# Patient Record
Sex: Male | Born: 1960 | Race: White | Hispanic: No | Marital: Married | Smoking: Never smoker
Health system: Southern US, Community
[De-identification: ages and names within clinical notes are randomized; demographics above are authoritative.]

## PROBLEM LIST (undated history)

## (undated) DIAGNOSIS — E7800 Pure hypercholesterolemia, unspecified: Secondary | ICD-10-CM

## (undated) HISTORY — PX: VASECTOMY: SHX75

## (undated) HISTORY — PX: HERNIA REPAIR: SHX51

---

## 2002-01-04 ENCOUNTER — Encounter: Payer: Self-pay | Admitting: Family Medicine

## 2002-01-04 ENCOUNTER — Encounter: Admission: RE | Admit: 2002-01-04 | Discharge: 2002-01-04 | Payer: Self-pay | Admitting: Family Medicine

## 2004-10-28 ENCOUNTER — Encounter: Admission: RE | Admit: 2004-10-28 | Discharge: 2004-10-28 | Payer: Self-pay | Admitting: Family Medicine

## 2005-10-24 IMAGING — CR DG SHOULDER 2+V*L*
3 series · 3 of 3 positions shown · non-contrast
Comparison: none

CLINICAL DATA: Left shoulder pain.  No trauma. 
 LEFT SHOULDER COMPLETE: 
 Three views of the left shoulder including AP, internally and externally rotated views, and a tangential view show no evidence of fracture, dislocation, or foreign body.

[view not recorded (1 of 3)]
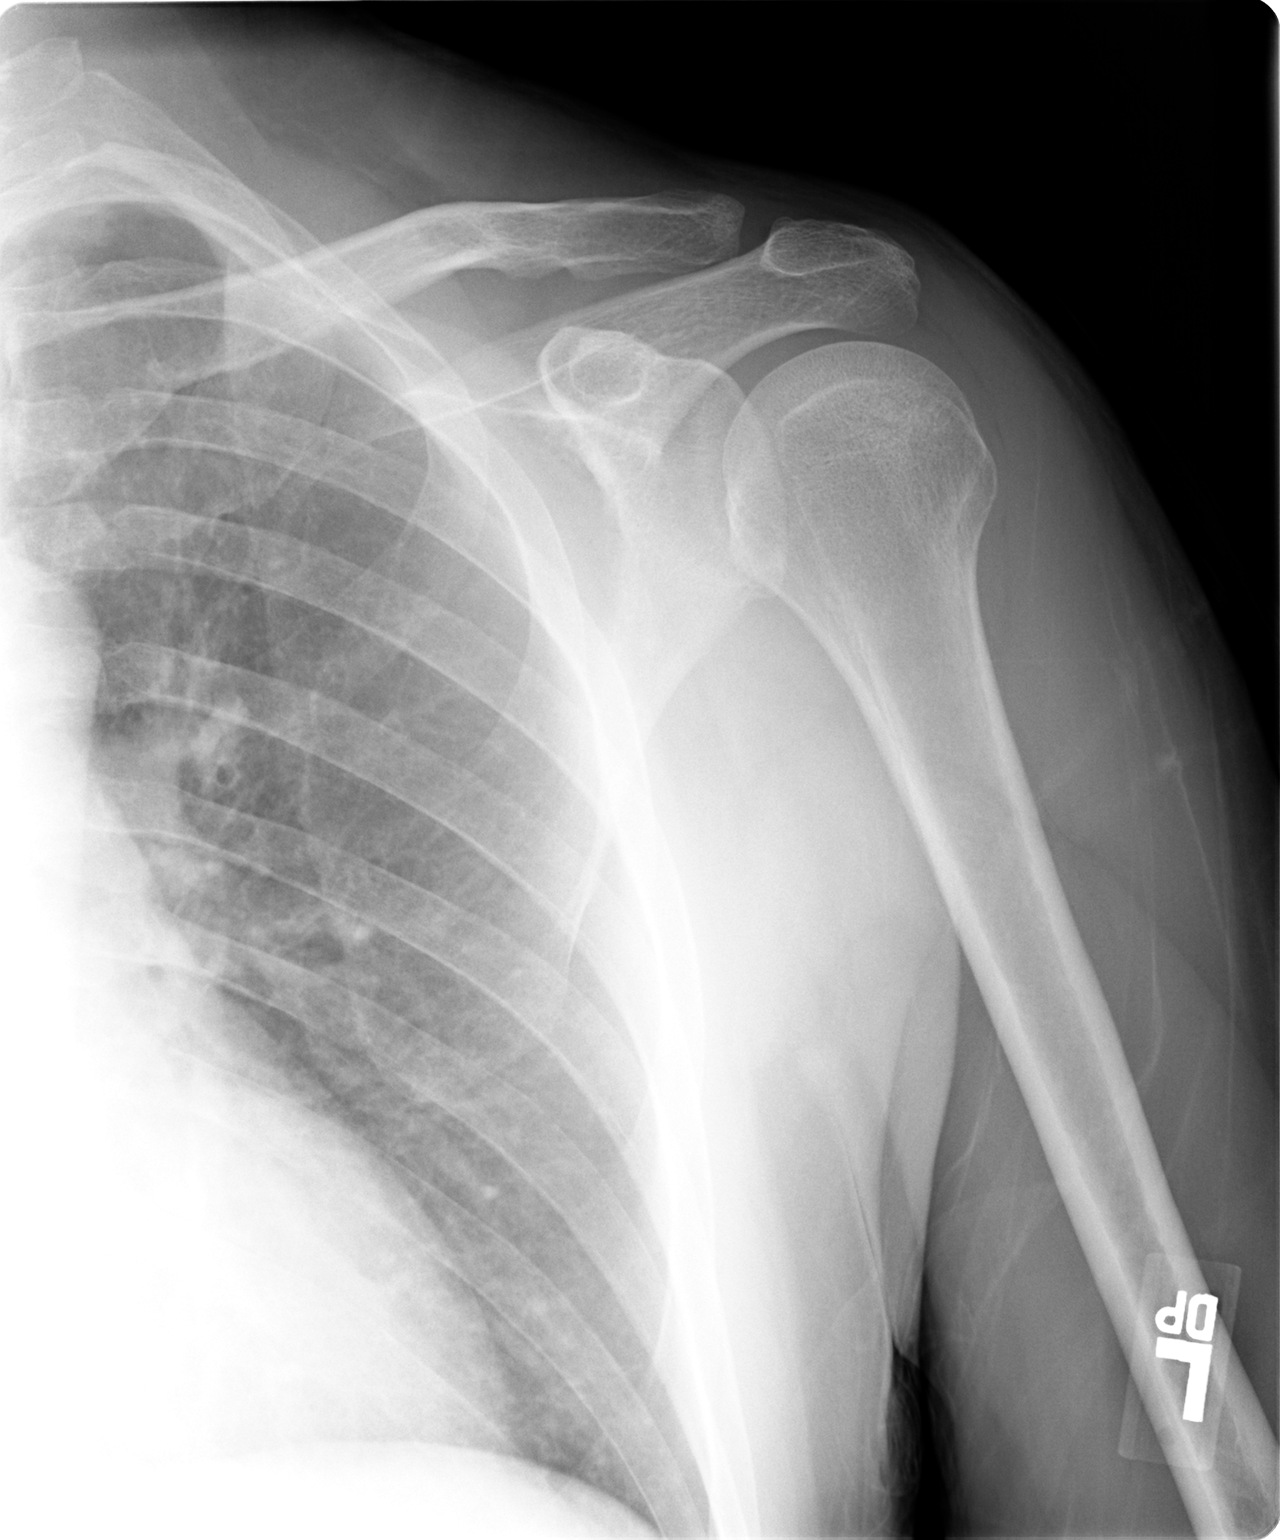

[view not recorded (2 of 3)]
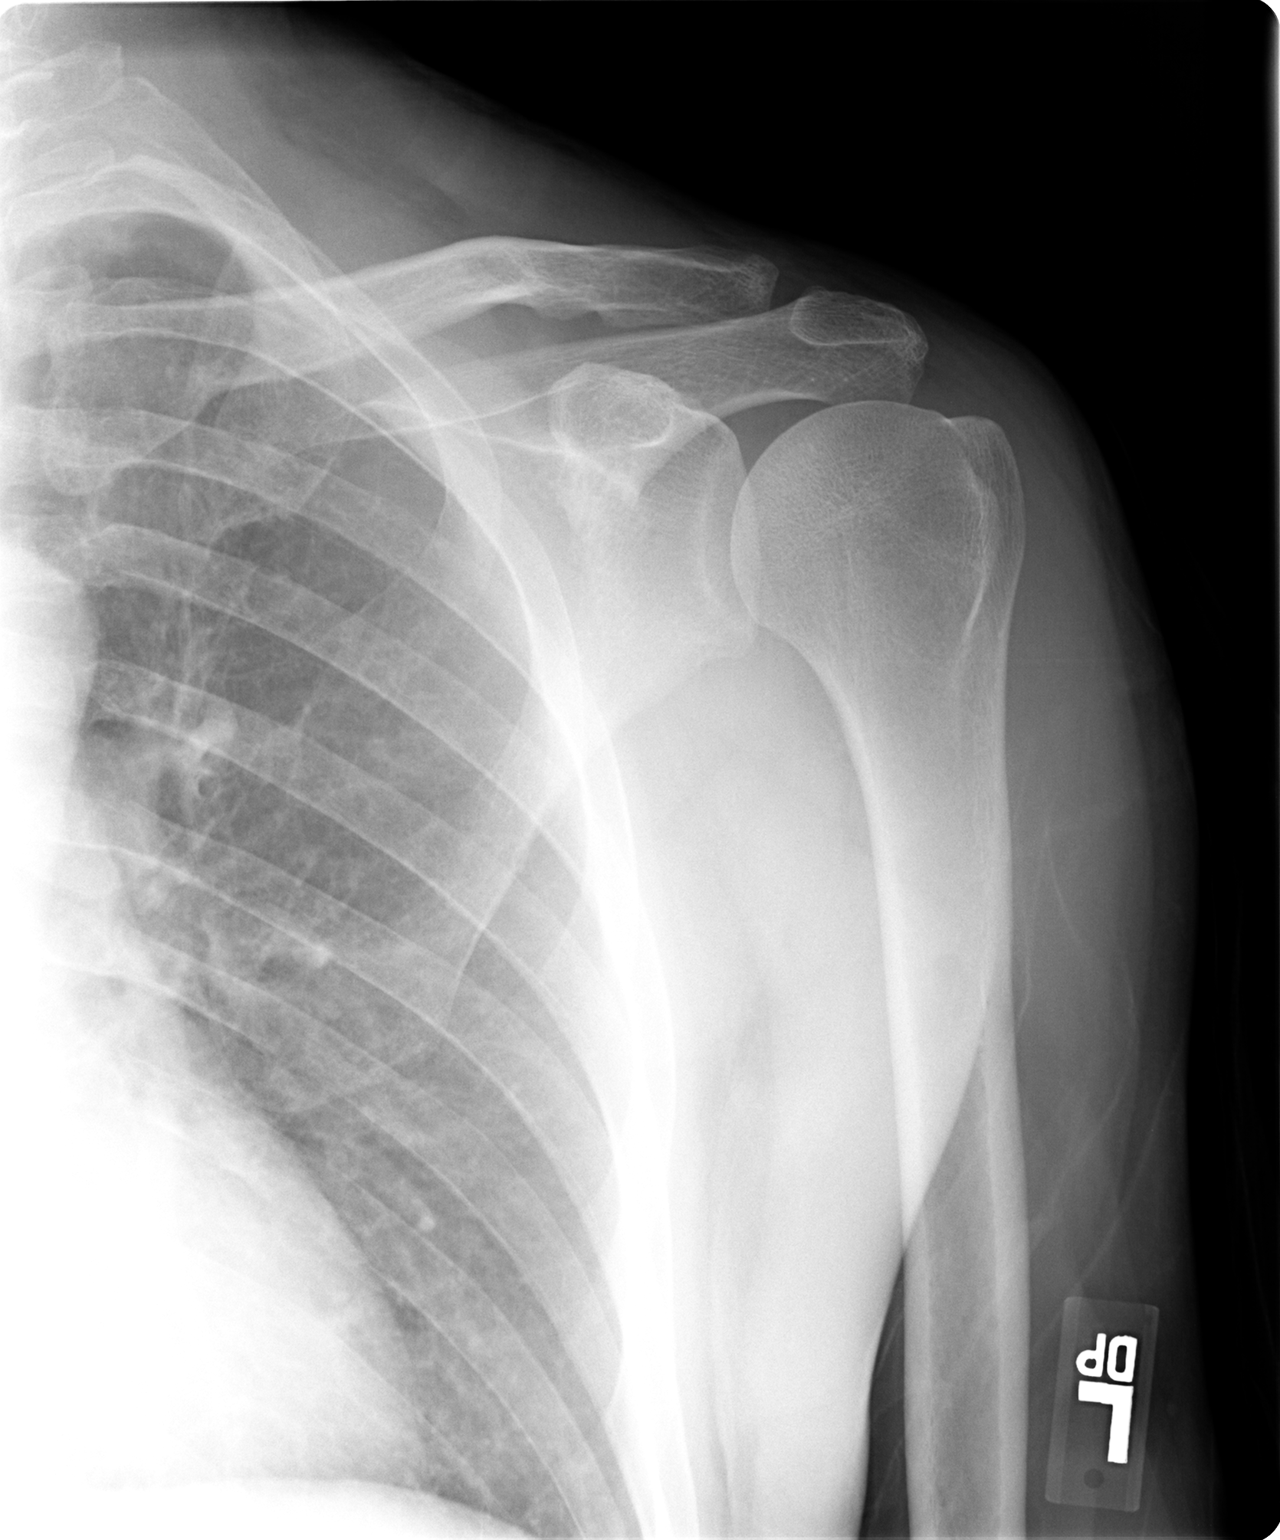

[view not recorded (3 of 3)]
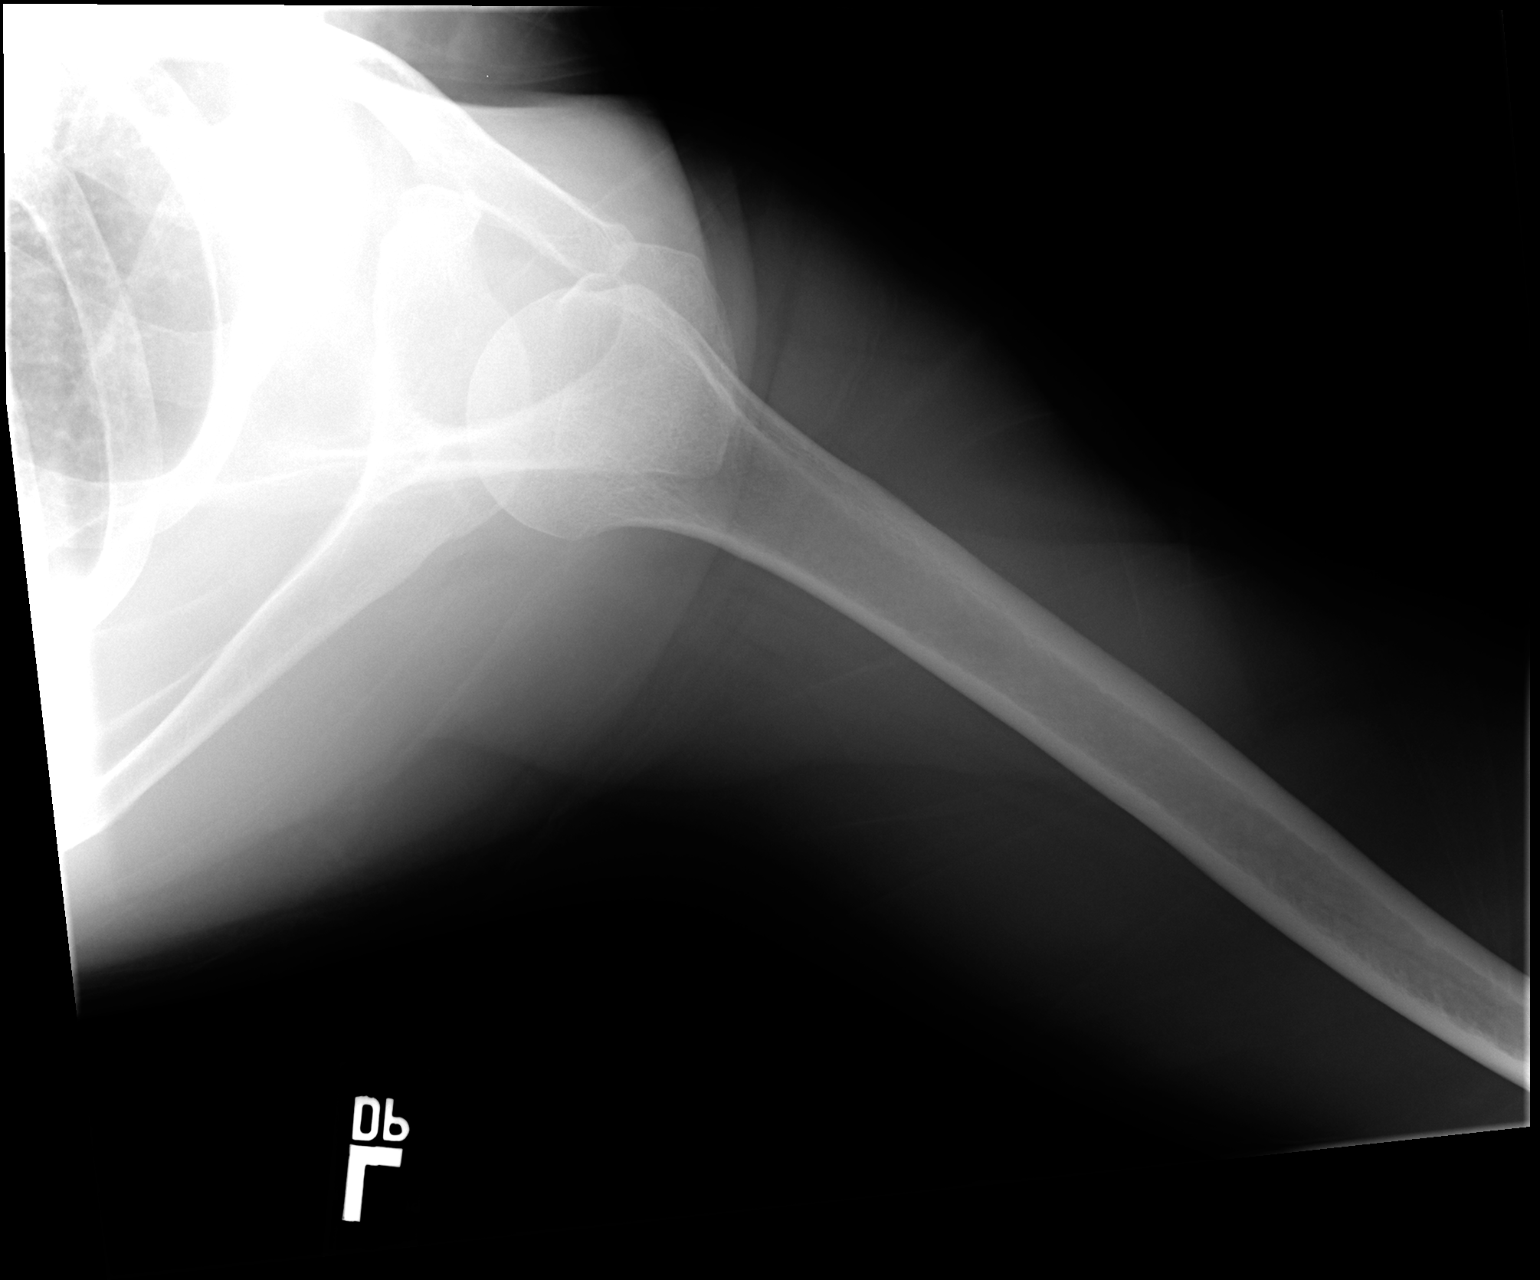

[3 of 3 positions shown; findings below may reference images not displayed]

IMPRESSION: Normal left shoulder.

## 2017-07-19 ENCOUNTER — Other Ambulatory Visit: Payer: Self-pay

## 2017-07-19 ENCOUNTER — Encounter (HOSPITAL_COMMUNITY): Payer: Self-pay | Admitting: Emergency Medicine

## 2017-07-19 ENCOUNTER — Emergency Department (HOSPITAL_COMMUNITY): Payer: Managed Care, Other (non HMO)

## 2017-07-19 ENCOUNTER — Emergency Department (HOSPITAL_COMMUNITY)
Admission: EM | Admit: 2017-07-19 | Discharge: 2017-07-19 | Disposition: A | Discharge status: Home / Self Care | Payer: Managed Care, Other (non HMO) | Attending: Emergency Medicine | Admitting: Emergency Medicine

## 2017-07-19 DIAGNOSIS — R197 Diarrhea, unspecified: Secondary | ICD-10-CM | POA: Diagnosis not present

## 2017-07-19 DIAGNOSIS — Z79899 Other long term (current) drug therapy: Secondary | ICD-10-CM | POA: Insufficient documentation

## 2017-07-19 DIAGNOSIS — R569 Unspecified convulsions: Secondary | ICD-10-CM | POA: Insufficient documentation

## 2017-07-19 HISTORY — DX: Pure hypercholesterolemia, unspecified: E78.00

## 2017-07-19 LAB — CBC WITH DIFFERENTIAL/PLATELET
Basophils Absolute: 0 10*3/uL (ref 0.0–0.1)
Basophils Relative: 0 %
Eosinophils Absolute: 0.1 10*3/uL (ref 0.0–0.7)
Eosinophils Relative: 0 %
HCT: 44.3 % (ref 39.0–52.0)
Hemoglobin: 15 g/dL (ref 13.0–17.0)
Lymphocytes Relative: 12 %
Lymphs Abs: 1.8 10*3/uL (ref 0.7–4.0)
MCH: 29.5 pg (ref 26.0–34.0)
MCHC: 33.9 g/dL (ref 30.0–36.0)
MCV: 87 fL (ref 78.0–100.0)
Monocytes Absolute: 0.7 10*3/uL (ref 0.1–1.0)
Monocytes Relative: 5 %
Neutro Abs: 12.7 10*3/uL — ABNORMAL HIGH (ref 1.7–7.7)
Neutrophils Relative %: 83 %
Platelets: 179 10*3/uL (ref 150–400)
RBC: 5.09 MIL/uL (ref 4.22–5.81)
RDW: 13 % (ref 11.5–15.5)
WBC: 15.3 10*3/uL — ABNORMAL HIGH (ref 4.0–10.5)

## 2017-07-19 LAB — PHOSPHORUS: PHOSPHORUS: 4.1 mg/dL (ref 2.5–4.6)

## 2017-07-19 LAB — COMPREHENSIVE METABOLIC PANEL
ALT: 28 U/L (ref 17–63)
AST: 54 U/L — ABNORMAL HIGH (ref 15–41)
Albumin: 3.7 g/dL (ref 3.5–5.0)
Alkaline Phosphatase: 64 U/L (ref 38–126)
Anion gap: 10 (ref 5–15)
BUN: 24 mg/dL — ABNORMAL HIGH (ref 6–20)
CO2: 26 mmol/L (ref 22–32)
Calcium: 8.4 mg/dL — ABNORMAL LOW (ref 8.9–10.3)
Chloride: 105 mmol/L (ref 101–111)
Creatinine, Ser: 1.29 mg/dL — ABNORMAL HIGH (ref 0.61–1.24)
GFR calc Af Amer: >60 mL/min (ref >60)
GFR calc non Af Amer: 60 mL/min — ABNORMAL LOW (ref >60)
Glucose, Bld: 179 mg/dL — ABNORMAL HIGH (ref 65–99)
Potassium: 4.2 mmol/L (ref 3.5–5.1)
Sodium: 141 mmol/L (ref 135–145)
Total Bilirubin: 0.8 mg/dL (ref 0.3–1.2)
Total Protein: 5.8 g/dL — ABNORMAL LOW (ref 6.5–8.1)

## 2017-07-19 LAB — RAPID URINE DRUG SCREEN, HOSP PERFORMED
Amphetamines: NOT DETECTED
BARBITURATES: NOT DETECTED
Benzodiazepines: NOT DETECTED
Cocaine: NOT DETECTED
Opiates: NOT DETECTED
Tetrahydrocannabinol: NOT DETECTED

## 2017-07-19 LAB — MAGNESIUM: MAGNESIUM: 1.9 mg/dL (ref 1.7–2.4)

## 2017-07-19 LAB — POC OCCULT BLOOD, ED: Fecal Occult Bld: POSITIVE — AB

## 2017-07-19 MED ORDER — LOPERAMIDE HCL 2 MG PO CAPS
4.0000 mg | ORAL_CAPSULE | Freq: Once | ORAL | Status: AC
  Administered 2017-07-19: 4 mg via ORAL
  Filled 2017-07-19: qty 2

## 2017-07-19 MED ORDER — LOPERAMIDE HCL 2 MG PO CAPS: 4.0000 mg | ORAL_CAPSULE | ORAL | 0 refills | Status: AC | PRN

## 2017-07-19 MED ORDER — MAGNESIUM SULFATE 2 GM/50ML IV SOLN
2.0000 g | Freq: Once | INTRAVENOUS | Status: AC
  Administered 2017-07-19: 2 g via INTRAVENOUS
  Filled 2017-07-19: qty 50

## 2017-07-19 MED ORDER — LEVETIRACETAM 500 MG PO TABS: 500.0000 mg | ORAL_TABLET | Freq: Two times a day (BID) | ORAL | 0 refills | Status: AC

## 2017-07-19 MED ORDER — LACTATED RINGERS IV BOLUS (SEPSIS)
1000.0000 mL | Freq: Once | INTRAVENOUS | Status: AC
  Administered 2017-07-19: 1000 mL via INTRAVENOUS

## 2017-07-19 NOTE — ED Notes (Signed)
Dr. Rhunette CroftNanavati ( EDP) explained tests results and plan of care to pt. and spouse.

## 2017-07-19 NOTE — ED Provider Notes (Signed)
[facility] EMERGENCY [facility] Provider Note   CSN: [id number] Arrival date & time: 07/19/17  0050     History   Chief Complaint Chief Complaint  Patient presents with  . Seizures  . Abdominal Cramping  . Urticaria    HPI Edwin Davis is a 56 y.o. male.  HPI  Patient comes in with chief complaint of seizure-like episode. Patient has history of high cholesterol.  He reports that prior to emergency room arrival he woke up feeling uneasy, started having some itching and abdominal cramping followed by seizure-like episode.  The episode was witnessed by patient's wife.  Patient's wife reports that she had turned away from the patient momentarily, and when she refocused on him patient was having generalized tonic-clonic shaking of the upper extremity.  Patient's episode lasted about 3-5 minutes.  There was no incontinence, however patient was slightly confused after the episode.  After EMS arrived patient had another brief episode with similar symptoms.  Patient reports that he had clammy skin prior to the seizure.  Patient denies any headaches, recent trauma.  Patient also denies any substance abuse, heavy alcohol use or smoking.  About a year ago patient had a seizure-like episode.  That episode occurred while patient was watching football game in 90 degree heat and drinking alcohol.  Additionally wife reports that at that time patient lost consciousness first and then had shaking.  She thinks that the second episode did reveal loss of consciousness followed by shaking.   Patient denies any family history of seizures or cardiac pathology.  Patient is having some abdominal discomfort with loose bowel movements.  Nurse reported that patient's diarrhea-like bowel movement had blood streak in it.  Patient has no history of GI bleed.  Patient has had 3 episodes in the ER of loose watery stools.  Past Medical History:  Diagnosis Date  . Hypercholesteremia     There  are no active problems to display for this patient.   Past Surgical History:  Procedure Laterality Date  . HERNIA REPAIR    . VASECTOMY         Home Medications    Prior to Admission medications   Medication Sig Start Date End Date Taking? Authorizing Provider  calcium carbonate (TUMS EX) 750 MG chewable tablet Chew 2 tablets by mouth once.   Yes [provider]  Lutein 10 MG TABS Take 10 mg by mouth daily.   Yes [provider]  simvastatin (ZOCOR) 20 MG tablet Take 20 mg by mouth daily.   Yes [provider]  levETIRAcetam (KEPPRA) 500 MG tablet Take 1 tablet (500 mg total) 2 (two) times daily by mouth. 07/19/17   Derwood KaplanNanavati, Kaiser Belluomini, MD  loperamide (IMODIUM) 2 MG capsule Take 2 capsules (4 mg total) as needed by mouth for diarrhea or loose stools (for night time symptoms only). 07/19/17   Derwood KaplanNanavati, Kynnedi Zweig, MD    Family History No family history on file.  Social History Social History   Tobacco Use  . Smoking status: Never Smoker  . Smokeless tobacco: Never Used  Substance Use Topics  . Alcohol use: Yes  . Drug use: No     Allergies   Hydrocodone   Review of Systems Review of Systems  All other systems reviewed and are negative.    Physical Exam Updated Vital Signs BP 138/86   Pulse 80   Resp 16   Ht 5\' 8"  (1.727 m)   Wt 77.1 kg (170 lb)  SpO2 94%   BMI 25.85 kg/m   Physical Exam  Constitutional: He is oriented to person, place, and time. He appears well-developed.  HENT:  Head: Normocephalic and atraumatic.  Eyes: Conjunctivae and EOM are normal. Pupils are equal, round, and reactive to light.  Neck: Normal range of motion. Neck supple.  Cardiovascular: Normal rate, regular rhythm and normal heart sounds.  Pulmonary/Chest: Effort normal and breath sounds normal. No respiratory distress. He has no wheezes.  Abdominal: Soft. Bowel sounds are normal. He exhibits no distension. There is tenderness. There is no rebound and no  guarding.  Neurological: He is alert and oriented to person, place, and time. No cranial nerve deficit. Coordination normal.  Skin: Skin is warm.  Nursing note and vitals reviewed.    ED Treatments / Results  Labs (all labs ordered are listed, but only abnormal results are displayed) Labs Reviewed  CBC WITH DIFFERENTIAL/PLATELET - Abnormal; Notable for the following components:      Result Value   WBC 15.3 (*)    Neutro Abs 12.7 (*)    All other components within normal limits  COMPREHENSIVE METABOLIC PANEL - Abnormal; Notable for the following components:   Glucose, Bld 179 (*)    BUN 24 (*)    Creatinine, Ser 1.29 (*)    Calcium 8.4 (*)    Total Protein 5.8 (*)    AST 54 (*)    GFR calc non Af Amer 60 (*)    All other components within normal limits  POC OCCULT BLOOD, ED - Abnormal; Notable for the following components:   Fecal Occult Bld POSITIVE (*)    All other components within normal limits  RAPID URINE DRUG SCREEN, HOSP PERFORMED  MAGNESIUM  PHOSPHORUS    EKG  EKG Interpretation None       Radiology Ct Head Wo Contrast  Result Date: 07/19/2017 CLINICAL DATA:  New onset seizure.  Assess TIA. EXAM: CT HEAD WITHOUT CONTRAST TECHNIQUE: Contiguous axial images were obtained from the base of the skull through the vertex without intravenous contrast. COMPARISON:  None. FINDINGS: BRAIN: No intraparenchymal hemorrhage, mass effect nor midline shift. The ventricles and sulci are normal. No acute large vascular territory infarcts. Focal hypodensity bilateral occipital lobes (axial 14/34) associated with calvarial scalloping. No abnormal extra-axial fluid collections. Basal cisterns are patent. VASCULAR: Trace calcific atherosclerosis carotid siphons. SKULL/SOFT TISSUES: No skull fracture. No significant soft tissue swelling. ORBITS/SINUSES: The included ocular globes and orbital contents are normal.RIGHT maxillary mucosal retention cysts and ethmoid sinus mucosal thickening  without air-fluid levels. Mastoid air cells are well aerated. OTHER: None. IMPRESSION: 1. Bioccipital skull beam hardening artifact, less likely small old infarcts. 2. Otherwise negative noncontrast CT HEAD. Electronically Signed   By: Awilda Metro M.D.   On: 07/19/2017 02:07    Procedures Procedures (including critical care time)  Medications Ordered in ED Medications  magnesium sulfate IVPB 2 g 50 mL (0 g Intravenous Stopped 07/19/17 0426)  lactated ringers bolus 1,000 mL (0 mLs Intravenous Stopped 07/19/17 0437)  loperamide (IMODIUM) capsule 4 mg (4 mg Oral Given 07/19/17 0426)     Initial Impression / Assessment and Plan / ED Course  I have reviewed the triage vital signs and the nursing notes.  Pertinent labs & imaging results that were available during my care of the patient were reviewed by me and considered in my medical decision making (see chart for details).  Clinical Course as of Jul 20 751  Wynelle Link Jul 19, 2017  1610  Results from the ER workup discussed with the patient face to face and all questions answered to the best of my ability. Strict ER return precautions have been discussed, and patient is agreeing with the plan and is comfortable with the workup done and the recommendations from the ER.   [AN]    Clinical Course User Index [AN] Derwood Kaplan, MD    Patient comes in with chief complaint of seizure-like episode DDx: -Seizure disorder -Meningitis -Trauma -ICH -Electrolyte abnormality -Metabolic derangement -Stroke -Toxin induced seizures -Medication side effects  Neurologic exam is nonfocal and patient is AAO x3 during my exam. Patient had 2 episodes of seizure-like activity with tonic-clonic shaking.  There was a very small postictal phase based on history.  Also there was no incontinence or tongue biting.  Patient had similar episode one year ago, at which time patient had a syncope seizure-like episode.  According to wife prior to the second episode  of seizure-like activity patient did lose consciousness for a brief second before he started shaking.  Medical history and social history is unremarkable.  CT scan of the head ordered given patient's age over 34, and it is normal.  No recent infection, no new medications, no family history of cardiovascular, neurologic disorders.  We will start patient on Keppra.  Case discussed with Dr. Onalee Hua, neurologist.  He Dr. Amada Jupiter thinks that since the patient episode lasted 3-5 minutes it is unlikely to be syncope seizure.  Patient will be asked to follow-up with neurologist as an outpatient.  Strict return precautions will be discussed.  As far as the GI symptoms are concerned, patient has had 3 loose bowel movements in the emergency room.  There has been streaks of blood in at least 2 of those episodes.  Hemoglobin is normal.  Patient is not on any blood thinners or antiplatelet agents.  It is possible that he is having colitis.  Although patient is having some cramping abdominal pain, his abdominal exam is nonsurgical.  For now we will treat the GI symptoms symptomatically only.  PCP follow-up advised if the symptoms persist.      Final Clinical Impressions(s) / ED Diagnoses   Final diagnoses:  Seizure-like activity (HCC)  Diarrhea, unspecified type    New Prescriptions This SmartLink is deprecated. Use AVSMEDLIST instead to display the medication list for a patient.   Derwood Kaplan, MD 07/19/17 [phone]

## 2017-07-19 NOTE — Discharge Instructions (Signed)
We are not sure what is causing your symptoms of abdominal cramping and diarrhea.  We suspect this could be a mild infection or food poisoning related issue.  Please hydrate well and take the diarrhea medicine for nighttime symptoms only. Symptoms get worse, you have worsening bloody stools, high fevers  The neurologist and I discussed the case about your seizure-like episode.  You could have primary seizures or a syncope seizure.  Because 1 of her episode lasted 3-5 minutes the neurologist recommends starting you on Keppra and having you see outpatient neurologist.  Return to the ER if you have a repeat seizure.  [place] law prevents people with seizures or fainting from driving or operating dangerous machinery until they are free of seizures or fainting for 6 months.

## 2017-07-19 NOTE — ED Triage Notes (Addendum)
Patient arrived with EMS from home reports 2 episodes of grand mal seizures this evening with chills , skin redness/skin itching and low abdominal cramping after eating grilled chicken , hamburger , peanuts and soda . Alert and oriented at arrival , he had a large amount of soft stool (mild pink in color) at arrival positive hemoccult test .

## 2017-07-20 ENCOUNTER — Encounter: Payer: Self-pay | Admitting: Neurology

## 2017-07-22 NOTE — ED Notes (Signed)
Pt. Called this Rn and requested a [facility].  Explained to pt. That our doctors do not have a form for a referral.  I explained to pt. That on his discharge papers that it is recommended for him to call a certain neurologist.  Pt. Reports that he did call the recommended Neurologist , but they are not able to give him a appointment for 2 months.  I recommended to him to call his PCP to possibly get a referral for [facility].  Pt. Was upset that we were not able to give him a referral to [facility].  I apologize to pt. 07/22/2017, 11:33

## 2017-10-01 ENCOUNTER — Ambulatory Visit: Payer: Managed Care, Other (non HMO) | Admitting: Neurology

## 2017-10-05 ENCOUNTER — Ambulatory Visit (INDEPENDENT_AMBULATORY_CARE_PROVIDER_SITE_OTHER): Payer: Managed Care, Other (non HMO) | Admitting: Neurology

## 2017-10-05 ENCOUNTER — Encounter: Payer: Self-pay | Admitting: Neurology

## 2017-10-05 VITALS — BP 146/80 | HR 79 | Ht 68.0 in | Wt 173.0 lb

## 2017-10-05 DIAGNOSIS — R55 Syncope and collapse: Secondary | ICD-10-CM

## 2017-10-05 NOTE — Progress Notes (Signed)
NEUROLOGY CONSULTATION NOTE  Edwin Davis [place] MRN: [id number] DOB: 06-18-1961  Referring provider: Dr. Derwood KaplanAnkit Nanavati (ER) Primary care provider: Dr. Blair Heysobert Ehinger  Reason for consult:  Seizure-like activity  Dear Dr Rhunette CroftNanavati:  Thank you for your kind referral of Edwin Davis [place] for consultation of the above symptoms. Although his history is well known to you, please allow me to reiterate it for the purpose of our medical record. The patient was accompanied to the clinic by his wife who also provides collateral information. Records and images were personally reviewed where available.  HISTORY OF PRESENT ILLNESS: This is a pleasant 57 year old right-handed man with a history of hyperlipidemia, presenting for evaluation of seizure-like activity that occurred last 07/19/2017. He was in his usual state of health then later in the afternoon he started feeling lightheaded, flushed, and sweaty with stomach cramping. He got off the commode and asked his wife for antacid and lay on the bathroom floor. When she came back, she found him unconscious with shivering/slight jerking of the upper extremities. She called EMS who gave her instructions, and when she came back to the bathroom, found him sitting on the floor, no confusion noted. He was asking who she was talking to on the phone. He wanted to sit on the chair and was still flushed, sweaty, lightheaded with abdominal cramping. He was sitting when EMS arrived and passed out again, no shaking. They were told his BP was so low. [facility] signs were normal. He started having diarrhea with blood-streaked stools in the ER. The diarrhea and cramping lasted for 6 hours, then self-resolved. He had a head CT which I personally reviewed, no acute changes seen. Due to concern for seizures, he was discharged home on Keppra, which he only took for a day after reading about potential side effects. They report another episode of loss of  consciousness in September 2017, they were at a football game in hot weather and he reached down for water, then he had significant back spasms and had a weird feeling, then passed out/slumped down for a couple of minutes. There was also slight shaking/upper extremity jerking as they tried to lay him down. He was also told his BP was low at that time.  He and his wife deny any staring/unresponsive episodes, gaps in time, olfactory/gustatory hallucinations, deja vu, rising epigastric sensation, focal numbness/tingling/weakness, myoclonic jerks. He denies any headaches, dizziness, diplopia, dysarthria/dysphagia, neck/back pain, further bowel/bladder dysfunction. His youngest sister has had episodes of suddenly passing out since her teenage years (he does not know the diagnosis). Otherwise he had a normal birth and early development.  There is no history of febrile convulsions, CNS infections such as meningitis/encephalitis, significant traumatic brain injury, neurosurgical procedures.  PAST MEDICAL HISTORY: Past Medical History:  Diagnosis Date  . Hypercholesteremia     PAST SURGICAL HISTORY: Past Surgical History:  Procedure Laterality Date  . HERNIA REPAIR    . VASECTOMY      MEDICATIONS: Current Outpatient Medications on File Prior to Visit  Medication Sig Dispense Refill  . calcium carbonate (TUMS EX) 750 MG chewable tablet Chew 2 tablets by mouth once.    . levETIRAcetam (KEPPRA) 500 MG tablet Take 1 tablet (500 mg total) 2 (two) times daily by mouth. (patient not taking) 60 tablet 0  . loperamide (IMODIUM) 2 MG capsule Take 2 capsules (4 mg total) as needed by mouth for diarrhea or loose stools (for night time symptoms only). 16  capsule 0  . Lutein 10 MG TABS Take 10 mg by mouth daily.    . simvastatin (ZOCOR) 20 MG tablet Take 20 mg by mouth daily.     No current facility-administered medications on file prior to visit.     ALLERGIES: Allergies  Allergen Reactions  . Hydrocodone  Hives    FAMILY HISTORY: No family history on file.  SOCIAL HISTORY: Social History   Socioeconomic History  . Marital status: Married    Spouse name: Not on file  . Number of children: Not on file  . Years of education: Not on file  . Highest education level: Not on file  Social Needs  . Financial resource strain: Not on file  . Food insecurity - worry: Not on file  . Food insecurity - inability: Not on file  . Transportation needs - medical: Not on file  . Transportation needs - non-medical: Not on file  Occupational History  . Not on file  Tobacco Use  . Smoking status: Never Smoker  . Smokeless tobacco: Never Used  Substance and Sexual Activity  . Alcohol use: Yes  . Drug use: No  . Sexual activity: Not on file  Other Topics Concern  . Not on file  Social History Narrative  . Not on file    REVIEW OF SYSTEMS: Constitutional: No fevers, chills, or sweats, no generalized fatigue, change in appetite Eyes: No visual changes, double vision, eye pain Ear, nose and throat: No hearing loss, ear pain, nasal congestion, sore throat Cardiovascular: No chest pain, palpitations Respiratory:  No shortness of breath at rest or with exertion, wheezes GastrointestinaI: No nausea, vomiting, diarrhea, abdominal pain, fecal incontinence Genitourinary:  No dysuria, urinary retention or frequency Musculoskeletal:  No neck pain, back pain Integumentary: No rash, pruritus, skin lesions Neurological: as above Psychiatric: No depression, insomnia, anxiety Endocrine: No palpitations, fatigue, diaphoresis, mood swings, change in appetite, change in weight, increased thirst Hematologic/Lymphatic:  No anemia, purpura, petechiae. Allergic/Immunologic: no itchy/runny eyes, nasal congestion, recent allergic reactions, rashes  PHYSICAL EXAM: Vitals:   10/05/17 1028  BP: (!) 146/80  Pulse: 79  SpO2: 98%   General: No acute distress Head:  Normocephalic/atraumatic Eyes: Fundoscopic exam  shows bilateral sharp discs, no vessel changes, exudates, or hemorrhages Neck: supple, no paraspinal tenderness, full range of motion Back: No paraspinal tenderness Heart: regular rate and rhythm Lungs: Clear to auscultation bilaterally. Vascular: No carotid bruits. Skin/Extremities: No rash, no edema Neurological Exam: Mental status: alert and oriented to person, place, and time, no dysarthria or aphasia, Fund of knowledge is appropriate.  Recent and remote memory are intact. 3/3 delayed recall. Attention and concentration are normal.    Able to name objects and repeat phrases. Cranial nerves: CN I: not tested CN II: pupils equal, round and reactive to light, visual fields intact, fundi unremarkable. CN III, IV, VI:  full range of motion, no nystagmus, no ptosis CN V: facial sensation intact CN VII: upper and lower face symmetric CN VIII: hearing intact to finger rub CN IX, X: gag intact, uvula midline CN XI: sternocleidomastoid and trapezius muscles intact CN XII: tongue midline Bulk & Tone: normal, no fasciculations. Motor: 5/5 throughout with no pronator drift. Sensation: intact to light touch, cold, pin, vibration and joint position sense.  No extinction to double simultaneous stimulation.  Romberg test negative Deep Tendon Reflexes: +2 throughout, no ankle clonus Plantar responses: downgoing bilaterally Cerebellar: no incoordination on finger to nose testing Gait: narrow-based and steady, able to tandem walk  adequately. Tremor: none  IMPRESSION: This is a pleasant 57 year old right-handed man with a history of hyperlipidemia, presenting after 2 brief episodes of loss of consciousness with seizure-like activity last 07/19/2017. This occurred in the setting of significant abdominal cramping (he later on had several hours of diarrhea). He had an episode of loss of consciousness in 05/2016 preceded by significant back spasms. Symptoms suggestive of vasovagal syncope/convulsive syncope.  His neurological exam is normal. Head CT unremarkable. We discussed that at this point there is no clear indication to start antiepileptic treatment. They know to call our office for any change in symptoms at which point further neurological testing such as an EEG will be done, otherwise he will follow-up on a prn basis.   Thank you for allowing me to participate in the care of this patient. Please do not hesitate to call for any questions or concerns.   Patrcia Dolly, M.D.  CC: Dr. Rhunette Croft, Dr. Manus Gunning

## 2017-10-05 NOTE — Patient Instructions (Signed)
Great meeting you! Follow-up on an as needed basis. Call for any change in symptoms.

## 2017-10-13 ENCOUNTER — Ambulatory Visit: Payer: Managed Care, Other (non HMO) | Admitting: Neurology

## 2020-02-24 DIAGNOSIS — Z1211 Encounter for screening for malignant neoplasm of colon: Secondary | ICD-10-CM | POA: Diagnosis not present

## 2020-02-24 DIAGNOSIS — Z86010 Personal history of colonic polyps: Secondary | ICD-10-CM | POA: Diagnosis not present

## 2020-10-12 DIAGNOSIS — Z79899 Other long term (current) drug therapy: Secondary | ICD-10-CM | POA: Diagnosis not present

## 2020-10-12 DIAGNOSIS — Z23 Encounter for immunization: Secondary | ICD-10-CM | POA: Diagnosis not present

## 2020-10-12 DIAGNOSIS — Z125 Encounter for screening for malignant neoplasm of prostate: Secondary | ICD-10-CM | POA: Diagnosis not present

## 2020-10-12 DIAGNOSIS — E7800 Pure hypercholesterolemia, unspecified: Secondary | ICD-10-CM | POA: Diagnosis not present

## 2020-10-12 DIAGNOSIS — Z0000 Encounter for general adult medical examination without abnormal findings: Secondary | ICD-10-CM | POA: Diagnosis not present

## 2020-10-12 DIAGNOSIS — Z5181 Encounter for therapeutic drug level monitoring: Secondary | ICD-10-CM | POA: Diagnosis not present

## 2021-10-15 DIAGNOSIS — E7800 Pure hypercholesterolemia, unspecified: Secondary | ICD-10-CM | POA: Diagnosis not present

## 2021-10-15 DIAGNOSIS — Z0000 Encounter for general adult medical examination without abnormal findings: Secondary | ICD-10-CM | POA: Diagnosis not present

## 2021-10-15 DIAGNOSIS — Z79899 Other long term (current) drug therapy: Secondary | ICD-10-CM | POA: Diagnosis not present

## 2021-10-15 DIAGNOSIS — Z125 Encounter for screening for malignant neoplasm of prostate: Secondary | ICD-10-CM | POA: Diagnosis not present

## 2021-10-23 DIAGNOSIS — F17200 Nicotine dependence, unspecified, uncomplicated: Secondary | ICD-10-CM | POA: Diagnosis not present

## 2021-10-23 DIAGNOSIS — J029 Acute pharyngitis, unspecified: Secondary | ICD-10-CM | POA: Diagnosis not present

## 2022-10-16 DIAGNOSIS — Z79899 Other long term (current) drug therapy: Secondary | ICD-10-CM | POA: Diagnosis not present

## 2022-10-16 DIAGNOSIS — E7800 Pure hypercholesterolemia, unspecified: Secondary | ICD-10-CM | POA: Diagnosis not present

## 2022-10-16 DIAGNOSIS — Z125 Encounter for screening for malignant neoplasm of prostate: Secondary | ICD-10-CM | POA: Diagnosis not present

## 2022-10-16 DIAGNOSIS — Z0000 Encounter for general adult medical examination without abnormal findings: Secondary | ICD-10-CM | POA: Diagnosis not present

## 2022-10-23 DIAGNOSIS — M545 Low back pain, unspecified: Secondary | ICD-10-CM | POA: Diagnosis not present

## 2023-01-23 DIAGNOSIS — E785 Hyperlipidemia, unspecified: Secondary | ICD-10-CM | POA: Diagnosis not present
